# Patient Record
Sex: Female | Born: 1975 | Race: Black or African American | Hispanic: No | Marital: Married | Smoking: Never smoker
Health system: Southern US, Community
[De-identification: ages and names within clinical notes are randomized; demographics above are authoritative.]

## PROBLEM LIST (undated history)

## (undated) DIAGNOSIS — E28319 Asymptomatic premature menopause: Secondary | ICD-10-CM

---

## 1997-08-29 ENCOUNTER — Emergency Department (HOSPITAL_COMMUNITY): Admission: EM | Admit: 1997-08-29 | Discharge: 1997-08-29 | Payer: Self-pay | Admitting: Unknown Physician Specialty

## 1997-08-31 ENCOUNTER — Emergency Department (HOSPITAL_COMMUNITY): Admission: EM | Admit: 1997-08-31 | Discharge: 1997-08-31 | Payer: Self-pay | Admitting: Emergency Medicine

## 1997-09-02 ENCOUNTER — Emergency Department (HOSPITAL_COMMUNITY): Admission: EM | Admit: 1997-09-02 | Discharge: 1997-09-02 | Payer: Self-pay | Admitting: Internal Medicine

## 1997-10-05 ENCOUNTER — Emergency Department (HOSPITAL_COMMUNITY): Admission: EM | Admit: 1997-10-05 | Discharge: 1997-10-05 | Payer: Self-pay | Admitting: Emergency Medicine

## 1997-12-28 ENCOUNTER — Emergency Department (HOSPITAL_COMMUNITY): Admission: EM | Admit: 1997-12-28 | Discharge: 1997-12-28 | Payer: Self-pay | Admitting: Internal Medicine

## 1998-04-09 ENCOUNTER — Emergency Department (HOSPITAL_COMMUNITY): Admission: EM | Admit: 1998-04-09 | Discharge: 1998-04-09 | Payer: Self-pay | Admitting: Emergency Medicine

## 1998-06-06 ENCOUNTER — Emergency Department (HOSPITAL_COMMUNITY): Admission: EM | Admit: 1998-06-06 | Discharge: 1998-06-06 | Payer: Self-pay | Admitting: Internal Medicine

## 1998-06-21 ENCOUNTER — Encounter: Payer: Self-pay | Admitting: Emergency Medicine

## 1998-06-21 ENCOUNTER — Emergency Department (HOSPITAL_COMMUNITY): Admission: EM | Admit: 1998-06-21 | Discharge: 1998-06-21 | Payer: Self-pay | Admitting: Emergency Medicine

## 2000-06-17 ENCOUNTER — Emergency Department (HOSPITAL_COMMUNITY): Admission: EM | Admit: 2000-06-17 | Discharge: 2000-06-17 | Payer: Self-pay | Admitting: Emergency Medicine

## 2001-04-28 ENCOUNTER — Emergency Department (HOSPITAL_COMMUNITY): Admission: EM | Admit: 2001-04-28 | Discharge: 2001-04-28 | Payer: Self-pay | Admitting: *Deleted

## 2001-11-07 ENCOUNTER — Emergency Department (HOSPITAL_COMMUNITY): Admission: EM | Admit: 2001-11-07 | Discharge: 2001-11-07 | Payer: Self-pay | Admitting: Emergency Medicine

## 2002-12-23 ENCOUNTER — Emergency Department (HOSPITAL_COMMUNITY): Admission: EM | Admit: 2002-12-23 | Discharge: 2002-12-23 | Payer: Self-pay | Admitting: Emergency Medicine

## 2003-08-02 ENCOUNTER — Emergency Department (HOSPITAL_COMMUNITY): Admission: EM | Admit: 2003-08-02 | Discharge: 2003-08-02 | Payer: Self-pay | Admitting: Emergency Medicine

## 2003-09-26 ENCOUNTER — Emergency Department (HOSPITAL_COMMUNITY): Admission: EM | Admit: 2003-09-26 | Discharge: 2003-09-26 | Payer: Self-pay | Admitting: Emergency Medicine

## 2004-02-27 ENCOUNTER — Emergency Department (HOSPITAL_COMMUNITY): Admission: EM | Admit: 2004-02-27 | Discharge: 2004-02-27 | Payer: Self-pay | Admitting: Emergency Medicine

## 2004-12-18 ENCOUNTER — Emergency Department (HOSPITAL_COMMUNITY): Admission: EM | Admit: 2004-12-18 | Discharge: 2004-12-19 | Payer: Self-pay | Admitting: Emergency Medicine

## 2005-04-27 ENCOUNTER — Emergency Department (HOSPITAL_COMMUNITY): Admission: EM | Admit: 2005-04-27 | Discharge: 2005-04-27 | Payer: Self-pay | Admitting: Emergency Medicine

## 2005-06-21 ENCOUNTER — Ambulatory Visit: Payer: Self-pay | Admitting: Obstetrics and Gynecology

## 2005-10-15 ENCOUNTER — Emergency Department (HOSPITAL_COMMUNITY): Admission: EM | Admit: 2005-10-15 | Discharge: 2005-10-16 | Payer: Self-pay | Admitting: Emergency Medicine

## 2007-02-25 ENCOUNTER — Emergency Department (HOSPITAL_COMMUNITY): Admission: EM | Admit: 2007-02-25 | Discharge: 2007-02-25 | Payer: Self-pay | Admitting: Family Medicine

## 2008-03-08 ENCOUNTER — Emergency Department (HOSPITAL_COMMUNITY): Admission: EM | Admit: 2008-03-08 | Discharge: 2008-03-08 | Payer: Self-pay | Admitting: Emergency Medicine

## 2008-11-11 ENCOUNTER — Emergency Department (HOSPITAL_COMMUNITY): Admission: EM | Admit: 2008-11-11 | Discharge: 2008-11-11 | Payer: Self-pay | Admitting: Family Medicine

## 2009-08-24 ENCOUNTER — Emergency Department (HOSPITAL_COMMUNITY): Admission: EM | Admit: 2009-08-24 | Discharge: 2009-08-25 | Payer: Self-pay | Admitting: Emergency Medicine

## 2010-03-17 LAB — URINALYSIS, ROUTINE W REFLEX MICROSCOPIC
Bilirubin Urine: NEGATIVE
Glucose, UA: NEGATIVE mg/dL
Hgb urine dipstick: NEGATIVE
Ketones, ur: NEGATIVE mg/dL
Nitrite: NEGATIVE
Protein, ur: NEGATIVE mg/dL
Specific Gravity, Urine: 1.026 (ref 1.005–1.030)
Urobilinogen, UA: 1 mg/dL (ref 0.0–1.0)
pH: 6 (ref 5.0–8.0)

## 2010-03-17 LAB — POCT PREGNANCY, URINE: Preg Test, Ur: NEGATIVE

## 2010-05-19 NOTE — Group Therapy Note (Signed)
NAMECLAUDETTE, Patricia Price NO.:  [id number]   MEDICAL RECORD NO.:  [id number]          PATIENT TYPE:  WOC   LOCATION:  [facility]                   FACILITY:  [facility]   PHYSICIAN:  Argentina Donovan, MD        DATE OF BIRTH:  04-Mar-1975   DATE OF SERVICE:                                    CLINIC NOTE   HISTORY OF PRESENT ILLNESS:  The patient is a 35 year old African-American  female gravida 1, para 1-0-0-1 who has an 55 year old child.  For the past  few years, she has been having periods every 2 weeks.  She is unable to take  any type of pill, and has a history very likely to be related to polycystic  ovarian syndrome as she has had abnormal hair growth on her face with slight  acne and __________ on the abdomen.  She has also had a significant weight  problem, being 5 feet 3 inches and 189 pounds.  She drives Medicare people  around.  It is a job, and has a complaint of significant hot flashes even  when the air conditioning is on and sweats.  We have discussed with her, and  she has been sent by the health department, the possible problem.  We cannot  use Glucophage as she cannot swallow pills, and we cannot use oral  contraceptives so I thought the best alternative is probably the Ortho-Evra  patch, and we have instructed her in its use.  We are also going to do a  hemoglobin A1C, free testosterone and a TSH level.   IMPRESSION:  1.  Probably polycystic ovarian syndrome.  2.  Hot flashes.  3.  Abnormal uterine bleeding.           ______________________________  Argentina Donovan, MD     PR/MEDQ  D:  06/21/2005  T:  06/21/2005  Job:  [id number]

## 2010-09-22 LAB — INFLUENZA A AND B ANTIGEN (CONVERTED LAB)
Inflenza A Ag: NEGATIVE
Influenza B Ag: NEGATIVE

## 2012-02-06 ENCOUNTER — Encounter (HOSPITAL_COMMUNITY): Payer: Self-pay | Admitting: Adult Health

## 2012-02-06 ENCOUNTER — Emergency Department (HOSPITAL_COMMUNITY)
Admission: EM | Admit: 2012-02-06 | Discharge: 2012-02-07 | Disposition: A | Discharge status: Home / Self Care | Payer: 59 | Attending: Emergency Medicine | Admitting: Emergency Medicine

## 2012-02-06 ENCOUNTER — Emergency Department (HOSPITAL_COMMUNITY): Payer: 59

## 2012-02-06 DIAGNOSIS — Y9389 Activity, other specified: Secondary | ICD-10-CM | POA: Insufficient documentation

## 2012-02-06 DIAGNOSIS — S139XXA Sprain of joints and ligaments of unspecified parts of neck, initial encounter: Secondary | ICD-10-CM | POA: Insufficient documentation

## 2012-02-06 DIAGNOSIS — R209 Unspecified disturbances of skin sensation: Secondary | ICD-10-CM | POA: Insufficient documentation

## 2012-02-06 DIAGNOSIS — S0990XA Unspecified injury of head, initial encounter: Secondary | ICD-10-CM | POA: Insufficient documentation

## 2012-02-06 DIAGNOSIS — S0003XA Contusion of scalp, initial encounter: Secondary | ICD-10-CM | POA: Insufficient documentation

## 2012-02-06 DIAGNOSIS — S161XXA Strain of muscle, fascia and tendon at neck level, initial encounter: Secondary | ICD-10-CM

## 2012-02-06 DIAGNOSIS — Y929 Unspecified place or not applicable: Secondary | ICD-10-CM | POA: Insufficient documentation

## 2012-02-06 DIAGNOSIS — W2209XA Striking against other stationary object, initial encounter: Secondary | ICD-10-CM | POA: Insufficient documentation

## 2012-02-06 NOTE — ED Provider Notes (Signed)
History     CSN: [id number]  Arrival date & time 02/06/12  1478   First MD Initiated Contact with Patient 02/06/12 2255      Chief Complaint  Patient presents with  . Head Injury   HPI  History provided by the patient. Patient is a 37 year old female with no significant PMH who presents after a head injury earlier today. 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 Since that time she has had focal pain and tenderness to the top of her scalp but also complained of some neck soreness and pains. She has also reported some occasional tingling in her arms and legs. These are brief and are not persistent. Patient really did not think much of her symptoms but was encouraged by friends and family to have her symptoms evaluated in the emergency room. She denies having any vision change. Denies any nausea vomiting. Denies any diffuse headache. Denies any weakness in extremities.    History reviewed. No pertinent past medical history.  History reviewed. No pertinent past surgical history.  History reviewed. No pertinent family history.  History  Substance Use Topics  . Smoking status: Never Smoker   . Smokeless tobacco: Not on file  . Alcohol Use: No    OB History    Grav Para Term Preterm Abortions TAB SAB Ect Mult Living                  Review of Systems  Constitutional: Negative for fever.  Gastrointestinal: Negative for nausea and vomiting.  Neurological: Positive for numbness. Negative for weakness and headaches.  All other systems reviewed and are negative.    Allergies  Review of patient's allergies indicates no known allergies.  Home Medications  No current outpatient prescriptions on file.  BP 136/89  Pulse 98  Temp 99 F (37.2 C) (Oral)  Resp 16  SpO2 100%  LMP 01/18/2012  Physical Exam  Nursing note and vitals  reviewed. Constitutional: She is oriented to person, place, and time. She appears well-developed and well-nourished. No distress.  HENT:  Head: Normocephalic and atraumatic.       No significant hematomas. No skull depressions. No battle sign or raccoon eyes.  Eyes: Conjunctivae normal and EOM are normal. Pupils are equal, round, and reactive to light.  Neck: Normal range of motion. Neck supple. No tracheal deviation present. No thyromegaly present.       Mild bilateral posterior neck tenderness  Cardiovascular: Normal rate and regular rhythm.   No murmur heard. Pulmonary/Chest: Effort normal and breath sounds normal. No stridor. No respiratory distress. She has no wheezes. She has no rales.  Abdominal: Soft. There is no tenderness. There is no rigidity, no rebound, no guarding, no CVA tenderness and no tenderness at McBurney's point.  Musculoskeletal: She exhibits no edema and no tenderness.  Neurological: She is alert and oriented to person, place, and time. She has normal strength. No cranial nerve deficit or sensory deficit. Gait normal.  Skin: Skin is warm and dry. No rash noted.  Psychiatric: She has a normal mood and affect. Her behavior is normal.    ED Course  Procedures   Ct Head Wo Contrast  02/06/2012  *RADIOLOGY REPORT*  Clinical Data:  Blunt head trauma.  Pain.  Numbness and tingling in both hands and legs.  CT HEAD  WITHOUT CONTRAST CT CERVICAL SPINE WITHOUT CONTRAST  Technique:  Multidetector CT imaging of the head and cervical spine was performed following the standard protocol without intravenous contrast.  Multiplanar CT image reconstructions of the cervical spine were also generated.  Comparison:   None  CT HEAD  Findings: There is no evidence of intracranial hemorrhage, brain edema or other signs of acute infarction.  There is no evidence of intracranial mass lesion or mass effect.  No abnormal extra-axial fluid collections are identified.  Ventricles are normal in size.  No  evidence of skull fracture or other bone abnormality.  IMPRESSION: Negative noncontrast head CT.  CT CERVICAL SPINE  Findings: No evidence of cervical spine fracture or subluxation. Intervertebral disc spaces are maintained.  No evidence of facet arthropathy or other significant bone abnormality.  IMPRESSION: Negative.  No evidence of cervical spine fracture or subluxation.   Original Report Authenticated By: Myles Rosenthal, M.D.    Ct Cervical Spine Wo Contrast  02/06/2012  *RADIOLOGY REPORT*  Clinical Data:  Blunt head trauma.  Pain.  Numbness and tingling in both hands and legs.  CT HEAD WITHOUT CONTRAST CT CERVICAL SPINE WITHOUT CONTRAST  Technique:  Multidetector CT imaging of the head and cervical spine was performed following the standard protocol without intravenous contrast.  Multiplanar CT image reconstructions of the cervical spine were also generated.  Comparison:   None  CT HEAD  Findings: There is no evidence of intracranial hemorrhage, brain edema or other signs of acute infarction.  There is no evidence of intracranial mass lesion or mass effect.  No abnormal extra-axial fluid collections are identified.  Ventricles are normal in size.  No evidence of skull fracture or other bone abnormality.  IMPRESSION: Negative noncontrast head CT.  CT CERVICAL SPINE  Findings: No evidence of cervical spine fracture or subluxation. Intervertebral disc spaces are maintained.  No evidence of facet arthropathy or other significant bone abnormality.  IMPRESSION: Negative.  No evidence of cervical spine fracture or subluxation.   Original Report Authenticated By: Myles Rosenthal, M.D.      1. Head injury   2. Scalp contusion   3. Cervical strain       MDM  Patient seen and evaluated. Patient appears well in no acute distress. Normal nonfocal neuro exam. CT of head and neck normal. Patient stable for discharge home.         Angus Seller, [place] 02/06/12 [phone]

## 2012-02-06 NOTE — ED Notes (Addendum)
Presents with head injury that occurred at 0930 this am. Stepping into a truck and hit top of head on door jamb, did not lose consciousness. This evening began having numbness and tingling to bilateral hands and bilateral legs. C/o tension in both shoulders. Movement makes numbness better.  Pt is alert and oriented, MAEX4. PERRLA. C-collar applied

## 2012-02-07 NOTE — ED Provider Notes (Signed)
Medical screening examination/treatment/procedure(s) were performed by non-physician practitioner and as supervising physician I was immediately available for consultation/collaboration.  Caison Hearn R. Refael Fulop, MD 02/07/12 0010 

## 2012-11-16 ENCOUNTER — Encounter (HOSPITAL_COMMUNITY): Payer: Self-pay | Admitting: *Deleted

## 2012-11-16 ENCOUNTER — Inpatient Hospital Stay (HOSPITAL_COMMUNITY)
Admission: AD | Admit: 2012-11-16 | Discharge: 2012-11-16 | Disposition: A | Discharge status: Home / Self Care | Payer: 59 | Source: Ambulatory Visit | Attending: Obstetrics and Gynecology | Admitting: Obstetrics and Gynecology

## 2012-11-16 DIAGNOSIS — N949 Unspecified condition associated with female genital organs and menstrual cycle: Secondary | ICD-10-CM

## 2012-11-16 DIAGNOSIS — N938 Other specified abnormal uterine and vaginal bleeding: Secondary | ICD-10-CM | POA: Insufficient documentation

## 2012-11-16 HISTORY — DX: Asymptomatic premature menopause: E28.319

## 2012-11-16 LAB — CBC
HCT: 36.3 % (ref 36.0–46.0)
Hemoglobin: 11.8 g/dL — ABNORMAL LOW (ref 12.0–15.0)
MCHC: 32.5 g/dL (ref 30.0–36.0)
RBC: 4.52 MIL/uL (ref 3.87–5.11)
WBC: 9.7 10*3/uL (ref 4.0–10.5)

## 2012-11-16 LAB — URINALYSIS, ROUTINE W REFLEX MICROSCOPIC
Bilirubin Urine: NEGATIVE
Ketones, ur: NEGATIVE mg/dL
Leukocytes, UA: NEGATIVE
Nitrite: NEGATIVE
Urobilinogen, UA: 0.2 mg/dL (ref 0.0–1.0)
pH: 6 (ref 5.0–8.0)

## 2012-11-16 LAB — URINE MICROSCOPIC-ADD ON

## 2012-11-16 LAB — POCT PREGNANCY, URINE: Preg Test, Ur: NEGATIVE

## 2012-11-16 MED ORDER — MEDROXYPROGESTERONE ACETATE 10 MG PO TABS: 10.0000 mg | ORAL_TABLET | Freq: Every day | ORAL | Status: AC

## 2012-11-16 NOTE — MAU Note (Signed)
Vaginal bleeding and cramping since Wednesday. Passing clots. LMP 11/12

## 2012-11-16 NOTE — MAU Note (Signed)
Patient unable to urinate at this time. 

## 2012-11-16 NOTE — MAU Provider Note (Signed)
History     CSN: [id number]  Arrival date and time: 11/16/12 0122   None     Chief Complaint  Patient presents with  . Vaginal Bleeding   HPI  Pt is a 37 yo here with report of vaginal bleeding that started 3 days ago.  Bleeding started out light but has gradually increased and now passing clots.  Cycle last month only lasted one day.  Pt states she was diagnosed with "early menopause years ago", reports having cycles every month for past two years.  Bleeding is also associated with uterine cramping.  +sexually active.      Past Medical History  Diagnosis Date  . Early menopause     History reviewed. No pertinent past surgical history.  Family History  Problem Relation Age of Onset  . Hypertension Father   . Hyperlipidemia Father   . Diabetes Father   . Heart disease Father     History  Substance Use Topics  . Smoking status: Never Smoker   . Smokeless tobacco: Not on file  . Alcohol Use: No    Allergies: No Known Allergies  Prescriptions prior to admission  Medication Sig Dispense Refill  . ibuprofen (ADVIL,MOTRIN) 200 MG tablet Take 400 mg by mouth every 6 (six) hours as needed.        Review of Systems  Gastrointestinal: Positive for abdominal pain (cramping). Negative for nausea and vomiting.  Genitourinary:       Vaginal bleeding  All other systems reviewed and are negative.   Physical Exam   Blood pressure 149/87, pulse 97, temperature 98.1 F (36.7 C), temperature source Oral, resp. rate 18, height 5\' 2"  (1.575 m), weight 107.502 kg (237 lb), last menstrual period 11/12/2012, SpO2 98.00%.  Physical Exam  Constitutional: She is oriented to person, place, and time. She appears well-developed and well-nourished. No distress.  HENT:  Head: Normocephalic.  Neck: Normal range of motion. Neck supple.  Cardiovascular: Normal rate, regular rhythm and normal heart sounds.   Respiratory: Effort normal and breath sounds normal.  GI: Soft. There is no  tenderness.  Genitourinary: There is bleeding (negative clots; scant bleeding) around the vagina.  Uterus and adnexa difficult to palpate secondary to weight  Neurological: She is alert and oriented to person, place, and time. She has normal reflexes.  Skin: Skin is warm and dry.    MAU Course  Procedures 0220 Pt unable to void - UPT pending, given water to help facilitate urination  Results for orders placed during the hospital encounter of 11/16/12 (from the past 24 hour(s))  WET PREP, GENITAL     Status: Abnormal   Collection Time    11/16/12  2:20 AM      Result Value Range   Yeast Wet Prep HPF POC NONE SEEN  NONE SEEN   Trich, Wet Prep NONE SEEN  NONE SEEN   Clue Cells Wet Prep HPF POC NONE SEEN  NONE SEEN   WBC, Wet Prep HPF POC RARE (*) NONE SEEN  URINALYSIS, ROUTINE W REFLEX MICROSCOPIC     Status: Abnormal   Collection Time    11/16/12  2:55 AM      Result Value Range   Color, Urine YELLOW  YELLOW   APPearance CLEAR  CLEAR   Specific Gravity, Urine 1.020  1.005 - 1.030   pH 6.0  5.0 - 8.0   Glucose, UA NEGATIVE  NEGATIVE mg/dL   Hgb urine dipstick LARGE (*) NEGATIVE   Bilirubin Urine NEGATIVE  NEGATIVE   Ketones, ur NEGATIVE  NEGATIVE mg/dL   Protein, ur NEGATIVE  NEGATIVE mg/dL   Urobilinogen, UA 0.2  0.0 - 1.0 mg/dL   Nitrite NEGATIVE  NEGATIVE   Leukocytes, UA NEGATIVE  NEGATIVE  URINE MICROSCOPIC-ADD ON     Status: None   Collection Time    11/16/12  2:55 AM      Result Value Range   Squamous Epithelial / LPF RARE  RARE   RBC / HPF TOO NUMEROUS TO COUNT  <3 RBC/hpf   Bacteria, UA RARE  RARE  POCT PREGNANCY, URINE     Status: None   Collection Time    11/16/12  3:04 AM      Result Value Range   Preg Test, Ur NEGATIVE  NEGATIVE  CBC     Status: Abnormal   Collection Time    11/16/12  3:24 AM      Result Value Range   WBC 9.7  4.0 - 10.5 K/uL   RBC 4.52  3.87 - 5.11 MIL/uL   Hemoglobin 11.8 (*) 12.0 - 15.0 g/dL   HCT 40.9  81.1 - 91.4 %   MCV 80.3   78.0 - 100.0 fL   MCH 26.1  26.0 - 34.0 pg   MCHC 32.5  30.0 - 36.0 g/dL   RDW 78.2  95.6 - 21.3 %   Platelets 212  150 - 400 K/uL    Assessment and Plan  Dysfunctional Uterine Bleeding  Plan: Discharge to home RX Provera 10 mg qd Pelvic ultrasound outpatient next week Follow-up in clinic in 2-3 weeks  [facility] 11/16/2012, 2:23 AM

## 2012-11-17 LAB — GC/CHLAMYDIA PROBE AMP: GC Probe RNA: NEGATIVE

## 2012-11-21 ENCOUNTER — Ambulatory Visit (HOSPITAL_COMMUNITY)
Admission: RE | Admit: 2012-11-21 | Discharge: 2012-11-21 | Disposition: A | Discharge status: Home / Self Care | Payer: 59 | Source: Ambulatory Visit | Attending: Family | Admitting: Family

## 2012-11-21 DIAGNOSIS — N938 Other specified abnormal uterine and vaginal bleeding: Secondary | ICD-10-CM

## 2012-11-21 DIAGNOSIS — N83209 Unspecified ovarian cyst, unspecified side: Secondary | ICD-10-CM | POA: Insufficient documentation

## 2012-11-21 DIAGNOSIS — N949 Unspecified condition associated with female genital organs and menstrual cycle: Secondary | ICD-10-CM | POA: Insufficient documentation

## 2012-12-08 ENCOUNTER — Telehealth: Payer: Self-pay | Admitting: *Deleted

## 2012-12-08 NOTE — Telephone Encounter (Signed)
Pt called nurse line requesting phone call back.

## 2012-12-09 NOTE — Telephone Encounter (Signed)
Called Patricia Price and left a message I am returning her call, if she still needs assistance please call back and leave a detailed message how we may help you

## 2012-12-11 NOTE — Telephone Encounter (Signed)
Called patient back stating I was returning her phone call. Patient stated she just wanted her ultrasound results. Reviewed results with patient and asked if she had a gyn doctor. Patient stated no, I told patient that from looking at the ER notes, they wanted her to follow up with [place] in the clinic and that with her permission I would tell the front office staff to schedule her an appt. Patient stated that was fine. Patient had no further questions

## 2012-12-11 NOTE — Telephone Encounter (Signed)
Called patient and a man answered stating she wasn't there at the moment and would like her know a nurse is trying to contact her.

## 2012-12-12 ENCOUNTER — Encounter: Payer: Self-pay | Admitting: Obstetrics & Gynecology

## 2013-01-14 ENCOUNTER — Encounter: Payer: Self-pay | Admitting: Obstetrics & Gynecology

## 2013-01-14 ENCOUNTER — Ambulatory Visit (INDEPENDENT_AMBULATORY_CARE_PROVIDER_SITE_OTHER): Payer: 59 | Admitting: Obstetrics & Gynecology

## 2013-01-14 VITALS — BP 138/81 | HR 94 | Temp 97.9°F | Ht 63.0 in | Wt 234.8 lb

## 2013-01-14 DIAGNOSIS — Z01812 Encounter for preprocedural laboratory examination: Secondary | ICD-10-CM

## 2013-01-14 DIAGNOSIS — N926 Irregular menstruation, unspecified: Secondary | ICD-10-CM

## 2013-01-14 LAB — POCT PREGNANCY, URINE: Preg Test, Ur: NEGATIVE

## 2013-01-14 NOTE — Progress Notes (Signed)
Subjective:     Patient ID: Patricia Price, female   DOB: 07/05/1975, [age] y.o.   MRN: [id number]  HPI Patient is a 38 yo female G1P0101 who presents for follow-up from the MAU for increased vaginal bleeding during menstruation.  Patient notes menses started during the 5th grade. She typically has one period a month that lasts for 4-5 days. She denies bleeding between periods. She states last month she had a period that was heavier than usual lasting for 7 days. She notes changing her pad every hour during this period and passing clots. She was seen in the MAU and had an US done that revealed an endometrium of 10.5 mm and question of adenomyomatosis and also a left ovarian cyst. Since that period she has had one normal period lasting for 4 days. She is not currently on birth control.   Review of Systems See HPI     Objective:   Physical Exam  Constitutional: She appears well-developed and well-nourished. No distress.  BP 138/81  Pulse 94  Temp(Src) 97.9 F (36.6 C)  Ht 5\' 3"  (1.6 m)  Wt 234 lb 12.8 oz (106.505 kg)  BMI 41.60 kg/m2  LMP 01/10/2013     Assessment:     Normal variation of menstrual cycle.     Plan:     Discussed that this is likely a normal variation in her menstrual cycle and that variation will increase with increasing age. Will have patient follow-up in 6 months and consider additional work-up if this is a persistent issue.

## 2013-01-14 NOTE — Patient Instructions (Signed)
Menstruation Menstruation is the monthly passing of blood, tissue, fluid and mucus, also know as a period. Your body is shedding the lining of the uterus. The flow, or amount of blood, usually lasts from 3 7 days each month. Hormones control the menstrual cycle. Hormones are a chemical substance produced by endocrine glands in the body to regulate different bodily functions. The first menstrual period may start any time between age 36 years to 16 years. However, it usually starts around age 12 years. Some girls have regular monthly menstrual cycles right from the beginning. However, it is not unusual to have only a couple of drops of blood or spotting when you first start menstruating. It is also not unusual to have two periods a month or miss a month or two when first starting your periods. SYMPTOMS   Mild to moderate abdominal cramps.  Aching or pain in the lower back area. Symptoms may occur 5 10 days before your menstrual period starts. These symptoms are referred to as premenstrual syndrome (PMS). These symptoms can include:  Headache.  Breast tenderness and swelling.  Bloating.  Tiredness (fatigue).  Mood changes.  Craving for certain foods. These are normal signs and symptoms and can vary in severity. To help relieve these problems, ask your caregiver if you can take over-the-counter medications for pain or discomfort. If the symptoms are not controllable, see your caregiver for help.  HORMONES INVOLVED IN MENSTRUATION Menstruation comes about because of hormones produced by the pituitary gland in the brain and the ovaries that affect the uterine lining. First, the pituitary gland in the brain produces the hormone follicle stimulating hormone (FSH). FSH stimulates the ovaries to produce estrogen, which thickens the uterine lining and begins to develop an egg in the ovary. About 14 days later, the pituitary gland produces another hormone called luteinizing hormone (LH). LH causes the egg  to come out of a sac in the ovary (ovulation). The empty sac on the ovary called the corpus luteum is stimulated by another hormone from the pituitary gland called luteotropin. The corpus luteum begins to produce the estrogen and progesterone hormone. The progesterone hormone prepares the lining of the uterus to have the fertilized egg (egg combined with sperm) attach to the lining of the uterus and begin to develop into a fetus. If the egg is not fertilized, the corpus luteum stops producing estrogen and progesterone, it disappears, the lining of the uterus sloughs off and a menstrual period begins. Then the menstrual cycle starts all over again and will continue monthly unless pregnancy occurs or menopause begins. The secretion of hormones is complex. Various parts of the body become involved in many chemical activities. Female sex hormones have other functions in a woman's body as well. Estrogen increases a woman's sex drive (libido). It naturally helps body get rid of fluids (diuretic). It also aids in the process of building new bone. Therefore, maintaining hormonal health is essential to all levels of a woman's well being. These hormones are usually present in normal amounts and cause you to menstruate. It is the relationship between the (small) levels of the hormones that is critical. When the balance is upset, menstrual irregularities can occur. HOW DOES THE MENSTRUAL CYCLE HAPPEN?  Menstrual cycles vary in length from 21 35 days with an average of 29 days. The cycle begins on the first day of bleeding. At this time, the pituitary gland in the brain releases FSH that travels through the bloodstream to the ovaries. The FSH stimulates the   follicles in the ovaries. This prepares the body for ovulation that occurs around the 14th day of the cycle. The ovaries produce estrogen, and this makes sure conditions are right in the uterus for implantation of the fertilized egg.  When the levels of estrogen reach a  high enough level, it signals the gland in the brain (pituitary gland) to release a surge of LH. This causes the release of the ripest egg from its follicle (ovulation). Usually only one follicle releases one egg, but sometimes more than one follicle releases an egg especially when stimulating the ovaries for in vitro fertilization. The egg can then be collected by either fallopian tube to await fertilization. The burst follicle within the ovary that is left behind is now called the corpus luteum or "yellow body." The corpus luteum continues to give off (secrete) reduced amounts of estrogen. This closes and hardens the cervix. It dries up the mucus to the naturally infertile condition.  The corpus luteum also begins to give off greater amounts of progesterone. This causes the lining of the uterus (endometrium) to thicken even more in preparation for the fertilized egg. The egg is starting to journey down from the fallopian tube to the uterus. It also signals the ovaries to stop releasing eggs. It assists in returning the cervical mucus to its infertile state.  If the egg implants successfully into the womb lining and pregnancy occurs, progesterone levels will continue to raise. It is often this hormone that gives some pregnant women a feeling of well being, like a "natural high." Progesterone levels drop again after childbirth.  If fertilization does not occur, the corpus luteum dies, stopping the production of hormones. This sudden drop in progesterone causes the uterine lining to break down, accompanied by blood (menstruation).  This starts the cycle back at day 1. The whole process starts all over again. Woman go through this cycle every month from puberty to menopause. Women have breaks only for pregnancy and breastfeeding (lactation), unless the woman has health problems that affect the female hormone system or chooses to use oral contraceptives to have unnatural menstrual periods. HOME CARE  INSTRUCTIONS   Keep track of your periods by using a calendar.  If you use tampons, get the least absorbent to avoid toxic shock syndrome.  Do not leave tampons in the vagina over night or longer than 6 hours.  Wear a sanitary pad over night.  Exercise 3 5 times a week or more.  Avoid foods and drinks that you know will make your symptoms worse before or during your period. SEEK MEDICAL CARE IF:   You develop a fever with your period.  Your periods are lasting more than 7 days.  Your period is so heavy that you have to change pads or tampons every 30 minutes.  You develop clots with your period and never had clots before.  You cannot get relief from over-the-counter medication for your symptoms.  Your period has not started, and it has been longer than 35 days. Document Released: 12/08/2001 Document Revised: 10/08/2012 Document Reviewed: 07/17/2012 [employer] Patient Information 2014 [employer].  

## 2013-01-14 NOTE — Progress Notes (Signed)
Patient ID: Patricia Price, female   DOB: 09/05/1975, 38 y.o.   MRN: [id number] Attestation of Attending Supervision of Resident: Evaluation and management procedures were performed by the [facility] Resident under my supervision.  I have seen and examined the patient, reviewed the resident's note and chart, and I agree with the management and plan.  D/w with the pt that since this was her very first abnormal menses that a workup is not needed.  She is sx free currently.  I reviewed the sono results with her and suspect that the ovarian cyst is benign given the place in her cycle.    I rec a f/u in 3-6 months if cycle becomes irregular or sooner prn   [name], M.D. 01/14/2013 3:42 PM

## 2013-11-02 ENCOUNTER — Encounter: Payer: Self-pay | Admitting: Obstetrics & Gynecology

## 2016-06-30 ENCOUNTER — Encounter (HOSPITAL_COMMUNITY): Payer: Self-pay | Admitting: *Deleted

## 2016-06-30 ENCOUNTER — Ambulatory Visit (HOSPITAL_COMMUNITY)
Admission: EM | Admit: 2016-06-30 | Discharge: 2016-06-30 | Disposition: A | Discharge status: Home / Self Care | Payer: BLUE CROSS/BLUE SHIELD | Attending: Family Medicine | Admitting: Family Medicine

## 2016-06-30 DIAGNOSIS — L739 Follicular disorder, unspecified: Secondary | ICD-10-CM

## 2016-06-30 DIAGNOSIS — N926 Irregular menstruation, unspecified: Secondary | ICD-10-CM

## 2016-06-30 LAB — POCT PREGNANCY, URINE: Preg Test, Ur: NEGATIVE

## 2016-06-30 MED ORDER — FLUCONAZOLE 40 MG/ML PO SUSR: 200.0000 mg | Freq: Every day | ORAL | 0 refills | Status: AC

## 2016-06-30 NOTE — Discharge Instructions (Signed)
I have made a referral to the women's outpatient clinic for regular, routine women's health services. They will contact you within a week to 2 weeks to set up an appointment.  For your rash, this is characteristic for a fungal folliculitis, this is most common in the summertime. I've given you a prescription for Diflucan, take 5 mL once daily for 7 days. If your symptoms persist, or fail to resolve, return to clinic in one to 2 weeks.

## 2016-06-30 NOTE — ED Triage Notes (Signed)
Rash  On  l   Arm  For   Several   Weeks     The  Rash  Itched  Earlier  It  Is  Better  Now       Pt  Is  Not  Taking  Any  Medication  For  The  Rash

## 2016-06-30 NOTE — ED Provider Notes (Signed)
CSN: [id number]     Arrival date & time 06/30/16  1707 History   First MD Initiated Contact with Patient 06/30/16 1737     Chief Complaint  Patient presents with  . Rash   (Consider location/radiation/quality/duration/timing/severity/associated sxs/prior Treatment) Patricia Price is a 41 y.o. female with a past history of early menopause, who presents to the [facility] urgent care with a chief complaint of rash on the left forearm has been present for several days. States that it is sometimes itchy, however he does not currently. Patient works in healthcare, and is concerned that this is "MRSA" she has no fever, chills, nausea, vomiting, or other systemic symptoms. The rash is not painful.  She is also 66, sexually active, does not use protection, states she has intermittent periods, is been approximately 2 months since her last period, states that she has been alternating with a period every other month for some time. She does not have a gynecologist, or other women's health provider, states that she does need to establish with one, would like a referral.    The history is provided by the patient.  Rash    Past Medical History:  Diagnosis Date  . Early menopause    History reviewed. No pertinent surgical history. Family History  Problem Relation Age of Onset  . Hypertension Father   . Hyperlipidemia Father   . Diabetes Father   . Heart disease Father    Social History  Substance Use Topics  . Smoking status: Never Smoker  . Smokeless tobacco: Never Used  . Alcohol use No   OB History    Gravida Para Term Preterm AB Living   1 1   1   1    SAB TAB Ectopic Multiple Live Births           1     Review of Systems  Constitutional: Negative.   HENT: Negative.   Respiratory: Negative.   Cardiovascular: Negative.   Gastrointestinal: Negative.   Genitourinary:       Irregular periods  Musculoskeletal: Negative.   Skin: Positive for rash.  Neurological: Negative.      Allergies  Patient has no known allergies.  Home Medications   Prior to Admission medications   Medication Sig Start Date End Date Taking? Authorizing Provider  fluconazole (DIFLUCAN) 40 MG/ML suspension Take 5 mLs (200 mg total) by mouth daily. 06/30/16 07/07/16  Patricia Bodo, NP  ibuprofen (ADVIL,MOTRIN) 200 MG tablet Take 400 mg by mouth every 6 (six) hours as needed.    [provider]  medroxyPROGESTERone (PROVERA) 10 MG tablet Take 1 tablet (10 mg total) by mouth daily. Use for ten days 11/16/12   Marlis Edelson, CNM  OVER THE COUNTER MEDICATION 15 mLs daily. For iron    [provider]  PRESCRIPTION MEDICATION 1 tablet daily. Antibiotic    [provider]   Meds Ordered and Administered this Visit  Medications - No data to display  BP 122/72 (BP Location: Right Arm)   Pulse 78   Temp 98.6 F (37 C) (Oral)   Resp 18   SpO2 100%  No data found.   Physical Exam  Constitutional: She is oriented to person, place, and time. She appears well-developed and well-nourished. No distress.  HENT:  Head: Normocephalic and atraumatic.  Right Ear: External ear normal.  Left Ear: External ear normal.  Eyes: Conjunctivae are normal.  Neck: Normal range of motion.  Neurological: She is alert and oriented to  person, place, and time.  Skin: Skin is warm and dry. Capillary refill takes less than 2 seconds. Rash noted. She is not diaphoretic. No erythema.  Proximately 4 x 6" area on the left forearm with multiple small pustules. No erythema noted.  Psychiatric: She has a normal mood and affect. Her behavior is normal.  Nursing note and vitals reviewed.   Urgent Care Course     Procedures (including critical care time)  Labs Review Labs Reviewed  POCT PREGNANCY, URINE    Imaging Review No results found.      MDM   1. Folliculitis   2. Irregular periods    Pregnancy test negative, referral made to the women's outpatient center for  routine gynecological care.  Rashes most consistent with fungal folliculitis, does not have an appearance of a "MRSA" infection. Treating presumptively with Diflucan, return to clinic in one week as needed.     Patricia Price, Patricia Hino, NP 06/30/16 1907

## 2016-07-11 ENCOUNTER — Other Ambulatory Visit: Payer: Self-pay | Admitting: *Deleted

## 2016-07-11 ENCOUNTER — Telehealth: Payer: Self-pay | Admitting: *Deleted

## 2016-07-11 DIAGNOSIS — N926 Irregular menstruation, unspecified: Secondary | ICD-10-CM

## 2016-07-11 NOTE — Telephone Encounter (Signed)
I called Patricia Price to notify her of US I have scheduled for before her new gyn appt.   I left a message I am calling her re: US appt , please call our office. Will have front office schedule gyn appt and call her or send letter notifying her of both appointments.

## 2016-07-20 ENCOUNTER — Encounter (HOSPITAL_COMMUNITY): Payer: Self-pay

## 2016-07-20 ENCOUNTER — Ambulatory Visit (HOSPITAL_COMMUNITY)
Admission: RE | Admit: 2016-07-20 | Discharge: 2016-07-20 | Disposition: A | Discharge status: Home / Self Care | Payer: BLUE CROSS/BLUE SHIELD | Source: Ambulatory Visit | Attending: Obstetrics & Gynecology | Admitting: Obstetrics & Gynecology

## 2016-07-20 DIAGNOSIS — N926 Irregular menstruation, unspecified: Secondary | ICD-10-CM

## 2016-08-08 ENCOUNTER — Encounter: Payer: BLUE CROSS/BLUE SHIELD | Admitting: Obstetrics and Gynecology

## 2017-11-18 ENCOUNTER — Other Ambulatory Visit: Payer: Self-pay | Admitting: Obstetrics

## 2017-11-18 DIAGNOSIS — R928 Other abnormal and inconclusive findings on diagnostic imaging of breast: Secondary | ICD-10-CM

## 2017-11-21 ENCOUNTER — Ambulatory Visit
Admission: RE | Admit: 2017-11-21 | Discharge: 2017-11-21 | Disposition: A | Discharge status: Home / Self Care | Payer: BLUE CROSS/BLUE SHIELD | Source: Ambulatory Visit | Attending: Obstetrics | Admitting: Obstetrics

## 2017-11-21 ENCOUNTER — Other Ambulatory Visit: Payer: Self-pay | Admitting: Obstetrics

## 2017-11-21 DIAGNOSIS — N63 Unspecified lump in unspecified breast: Secondary | ICD-10-CM

## 2017-11-21 DIAGNOSIS — R928 Other abnormal and inconclusive findings on diagnostic imaging of breast: Secondary | ICD-10-CM

## 2018-05-21 ENCOUNTER — Other Ambulatory Visit: Payer: BLUE CROSS/BLUE SHIELD

## 2018-05-23 ENCOUNTER — Ambulatory Visit
Admission: RE | Admit: 2018-05-23 | Discharge: 2018-05-23 | Disposition: A | Discharge status: Home / Self Care | Payer: BLUE CROSS/BLUE SHIELD | Source: Ambulatory Visit | Attending: Obstetrics | Admitting: Obstetrics

## 2018-05-23 ENCOUNTER — Other Ambulatory Visit: Payer: Self-pay | Admitting: Obstetrics

## 2018-05-23 ENCOUNTER — Other Ambulatory Visit: Payer: Self-pay

## 2018-05-23 DIAGNOSIS — N6320 Unspecified lump in the left breast, unspecified quadrant: Secondary | ICD-10-CM

## 2018-05-23 DIAGNOSIS — N63 Unspecified lump in unspecified breast: Secondary | ICD-10-CM

## 2018-12-01 ENCOUNTER — Other Ambulatory Visit: Payer: BLUE CROSS/BLUE SHIELD

## 2019-02-22 IMAGING — US ULTRASOUND LEFT BREAST LIMITED
1 series · 13 of 18 positions shown · non-contrast
Comparison: Baseline screening mammogram 11/14/2017

CLINICAL DATA: Screening recall from baseline for 3 possible left
breast masses.

EXAM:
DIGITAL DIAGNOSTIC LEFT MAMMOGRAM WITH CAD AND TOMO
ULTRASOUND LEFT BREAST

[Series 1: ultrasound left breast limited · 0.09mm/px · 13 of 18 slices shown]
[im 1/18]
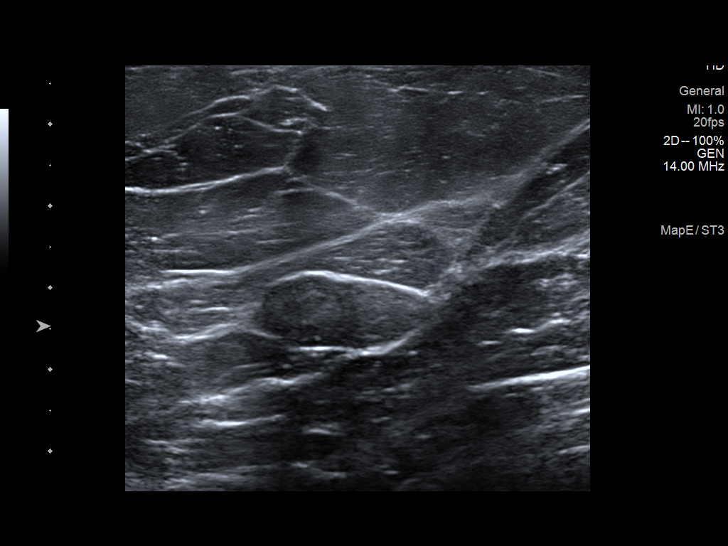
[im 3/18]
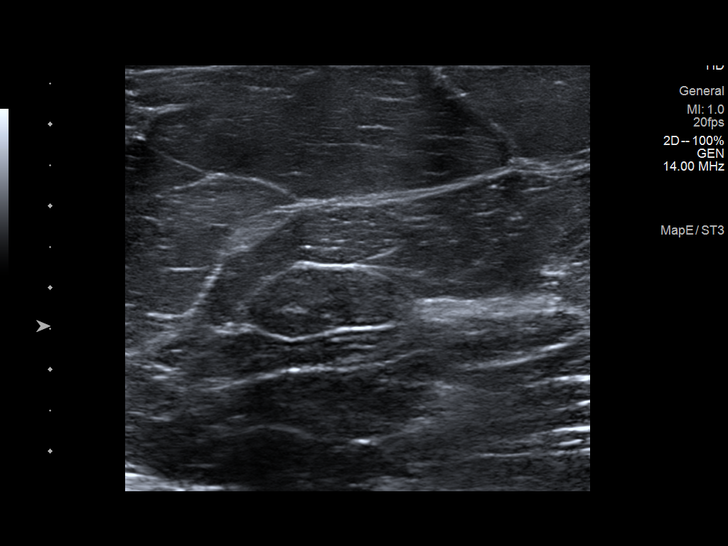
[im 4/18]
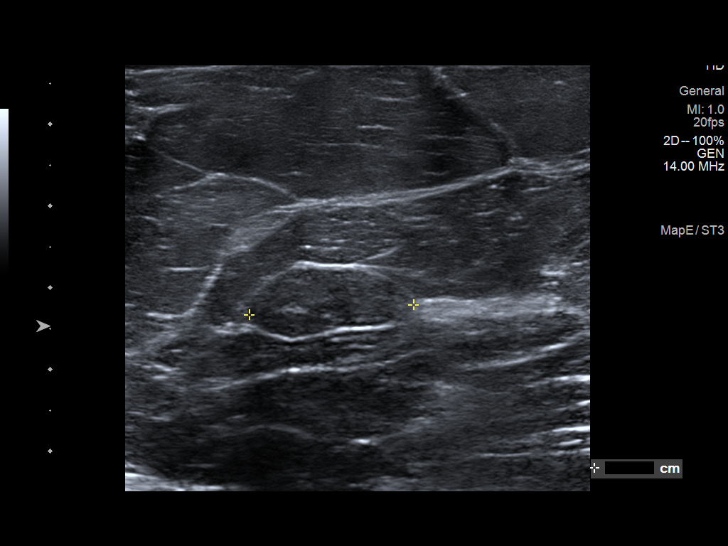
[im 5/18]
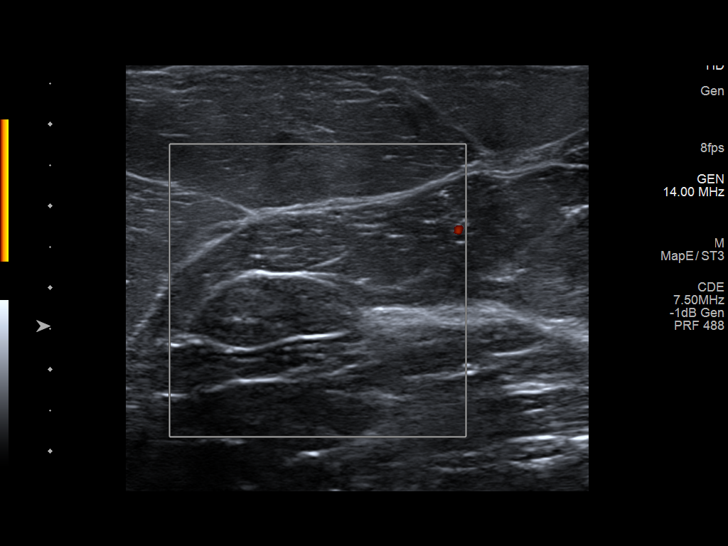
[im 7/18]
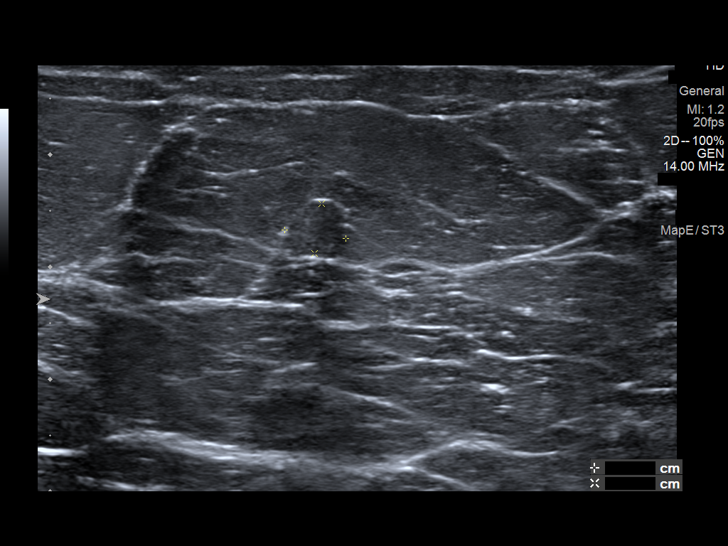
[im 8/18]
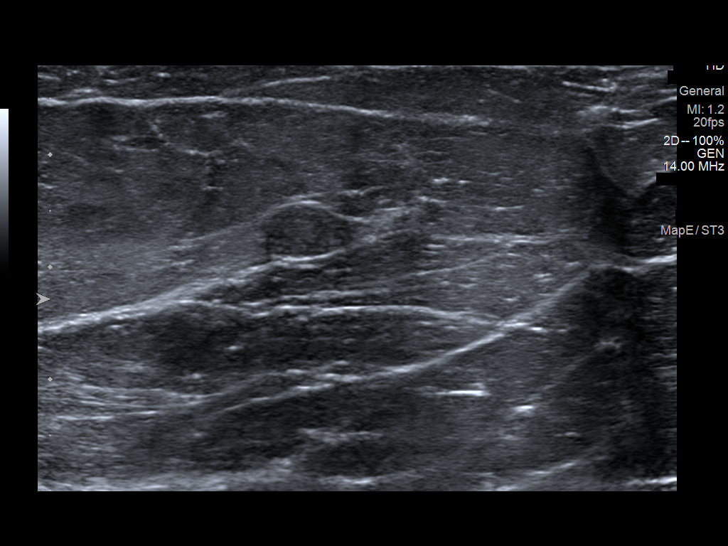
[im 10/18]
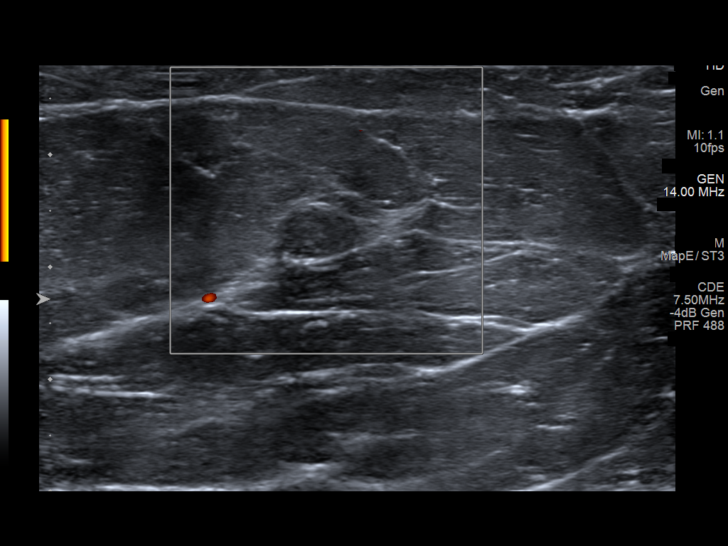
[im 11/18]
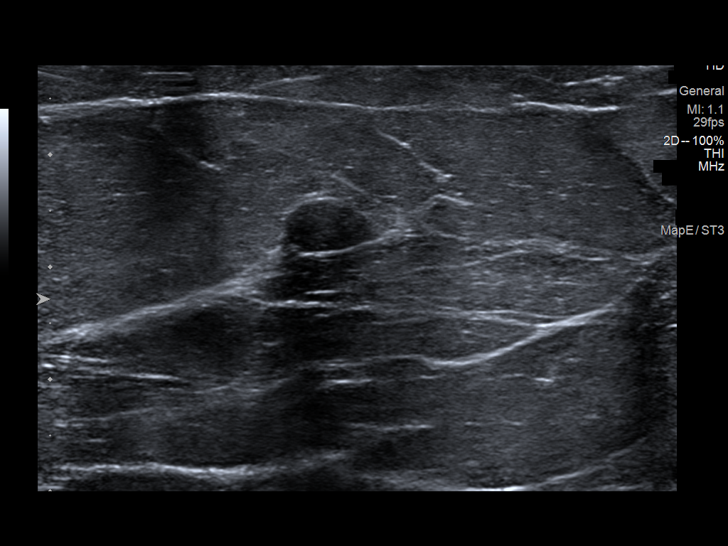
[im 12/18]
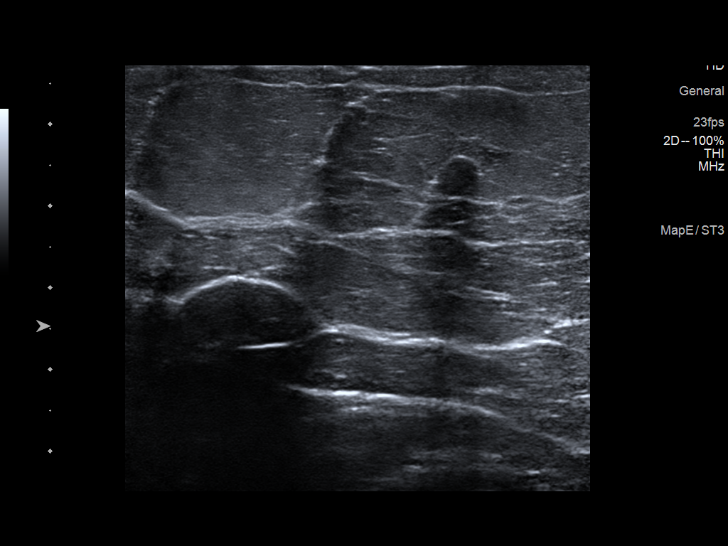
[im 14/18]
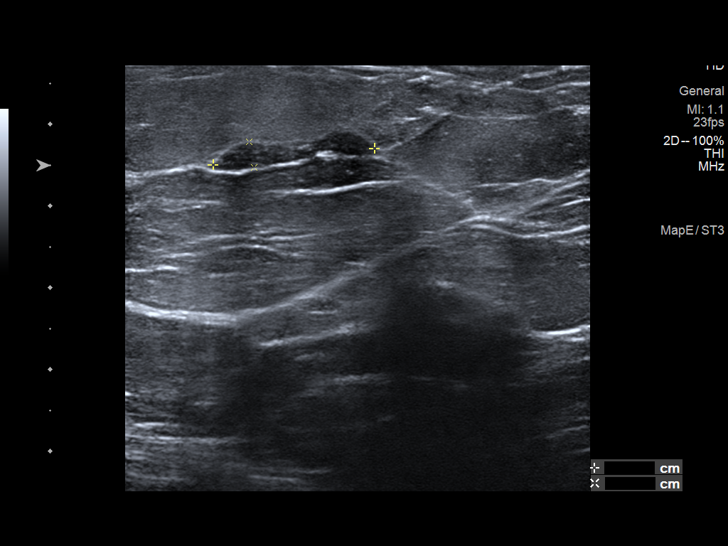
[im 15/18]
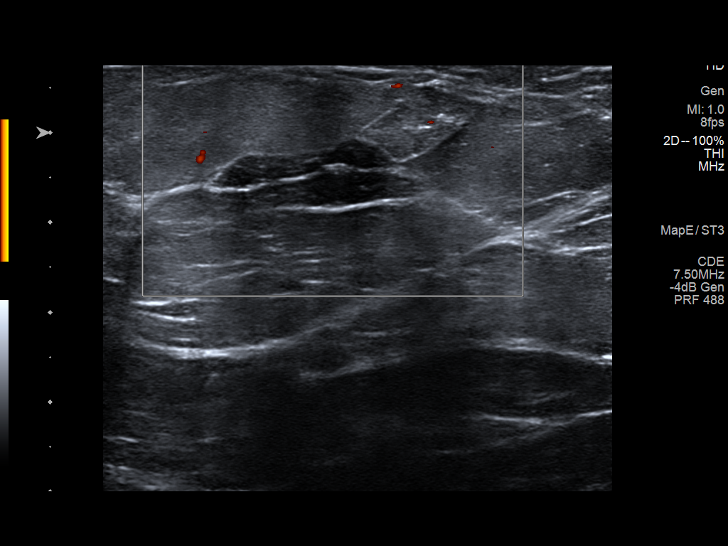
[im 16/18]
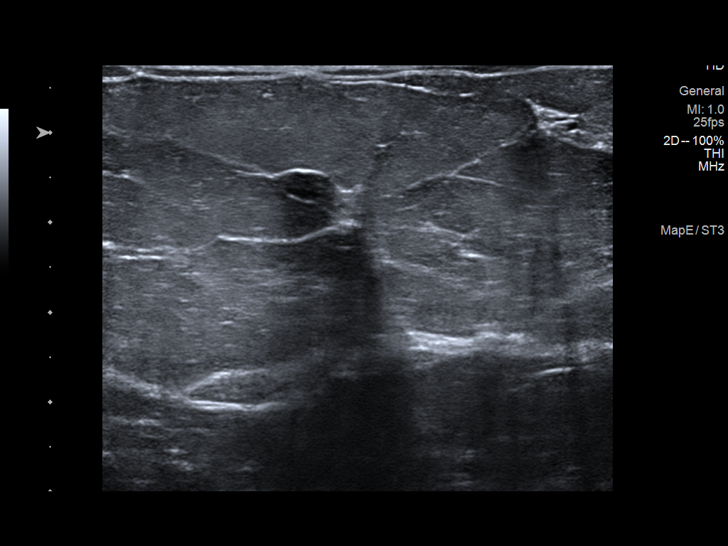
[im 18/18]
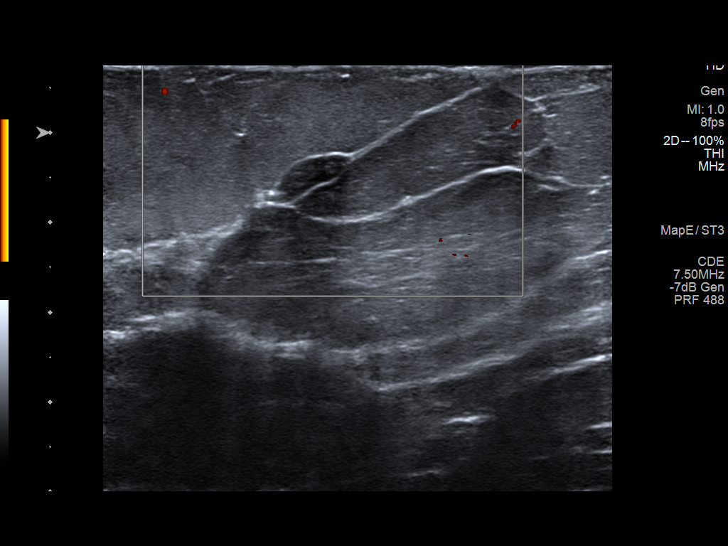

[13 of 18 positions shown; findings below may reference images not displayed]

ACR Breast Density Category b: There are scattered areas of
fibroglandular density.
FINDINGS: Spot compression tomosynthesis images of the left breast
demonstrates dense there are 3 low-density oval circumscribed masses
in the medial left breast, the largest measuring approximately
cm.

Mammographic images were processed with CAD.

Ultrasound of the left breast at 10 o'clock, 8 cm from the nipple
demonstrates an isoechoic oval circumscribed mass measuring 2.2 x
0.8 x 2.0 cm.

At 11 o'clock, 5 cm from the nipple there is an oval isoechoic
circumscribed mass measuring 0.8 x 0.5 x 0.6 cm.

At 11 o'clock, 8 cm from the nipple there is an isoechoic oval
circumscribed mass measuring 2.0 x 0.3 x 0.7 cm.

All of these masses are hypoechoic with harmonics imaging.
IMPRESSION: 1. There are 3 probably benign masses in the left breast at 10 and
11 o'clock, favored to represent fibroadenomas.

RECOMMENDATION:
Six-month follow-up left breast ultrasound is recommended.

I have discussed the findings and recommendations with the patient.
Results were also provided in writing at the conclusion of the
visit. If applicable, a reminder letter will be sent to the patient
regarding the next appointment.

BI-RADS CATEGORY  3: Probably benign.

## 2019-02-22 IMAGING — MG DIGITAL DIAGNOSTIC UNILATERAL LEFT MAMMOGRAM WITH TOMO AND CAD
4 series · 4 of 12 positions shown · non-contrast
Comparison: Baseline screening mammogram 11/14/2017

CLINICAL DATA: Screening recall from baseline for 3 possible left
breast masses.

EXAM:
DIGITAL DIAGNOSTIC LEFT MAMMOGRAM WITH CAD AND TOMO
ULTRASOUND LEFT BREAST

[L MLO synth-2D]
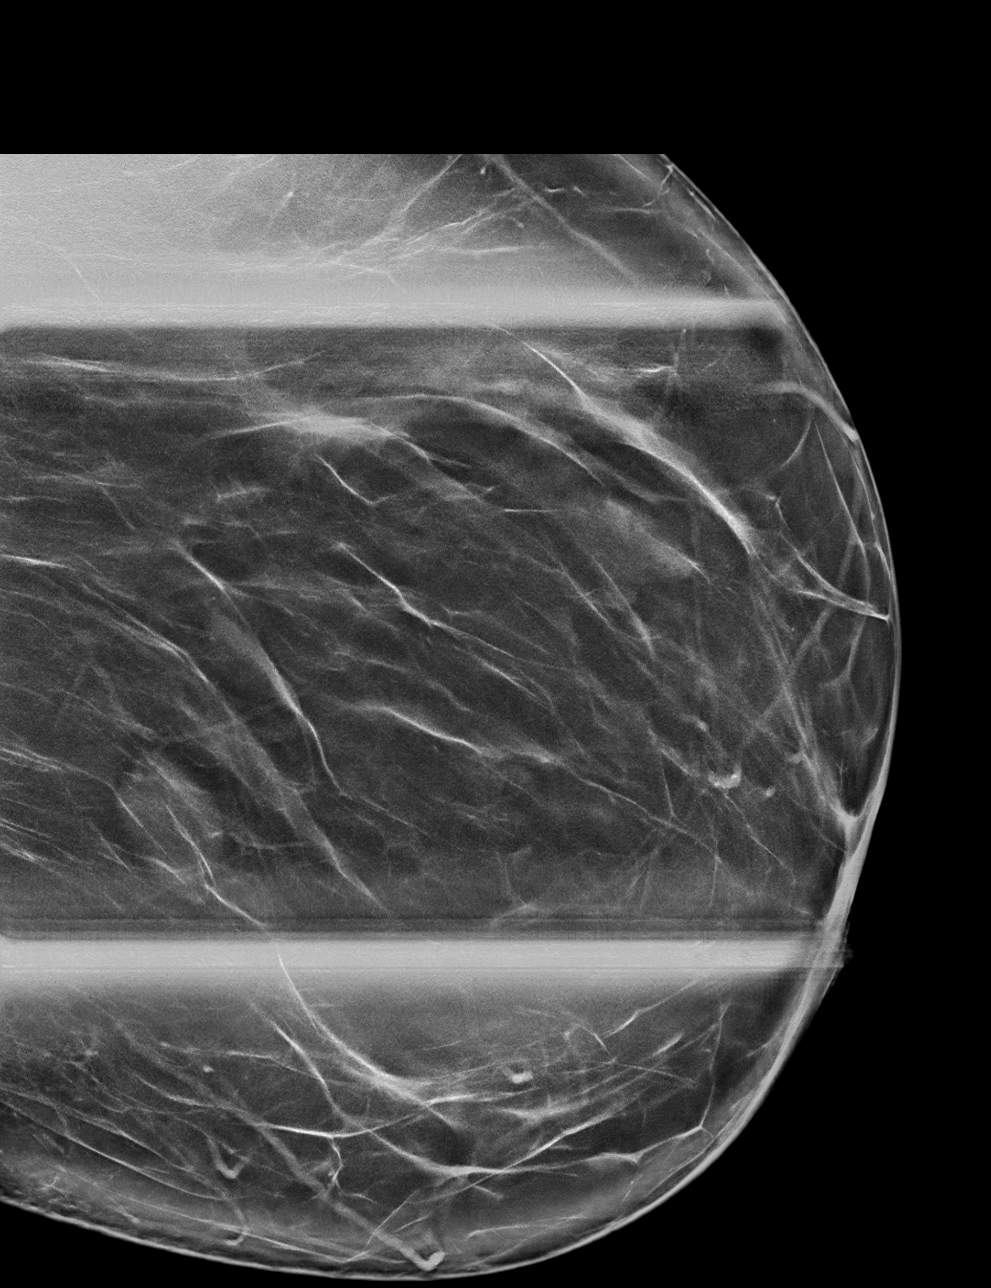

[L CC synth-2D]
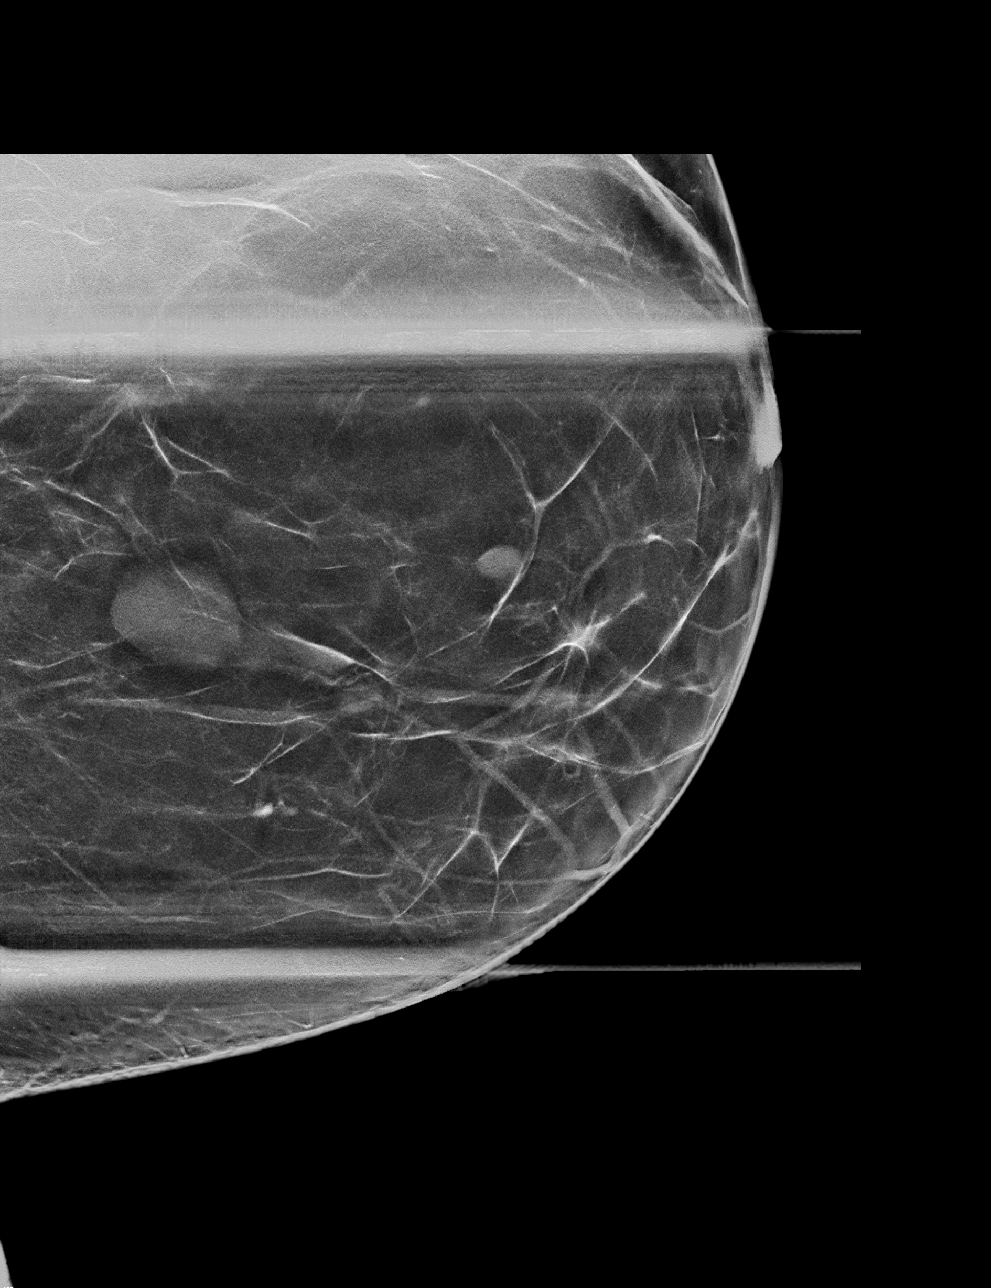

[L CC tomo · tomo slice 37/74.0]
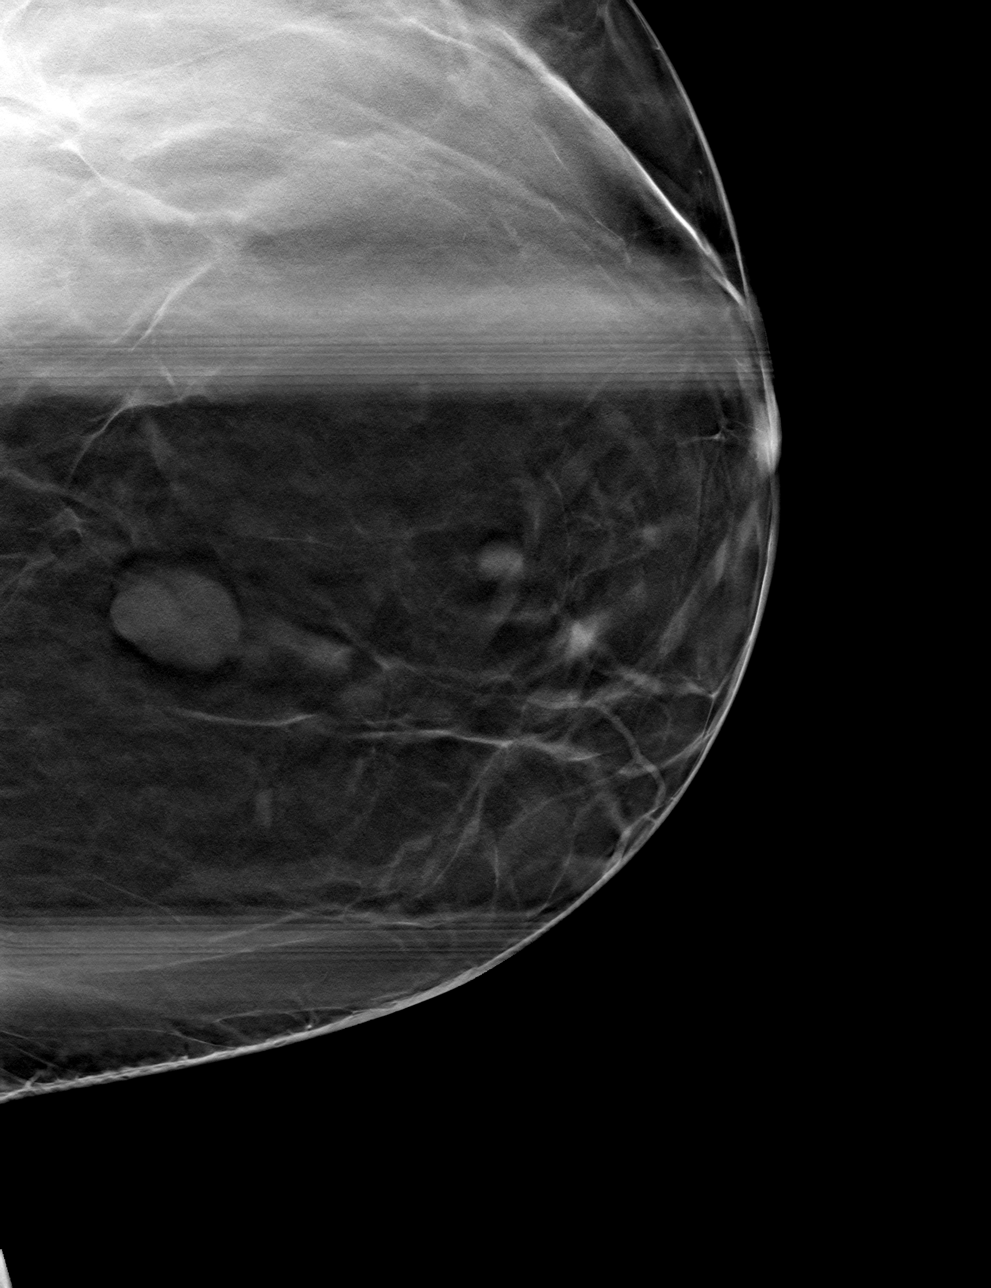

[L MLO tomo · tomo slice 46/91.0]
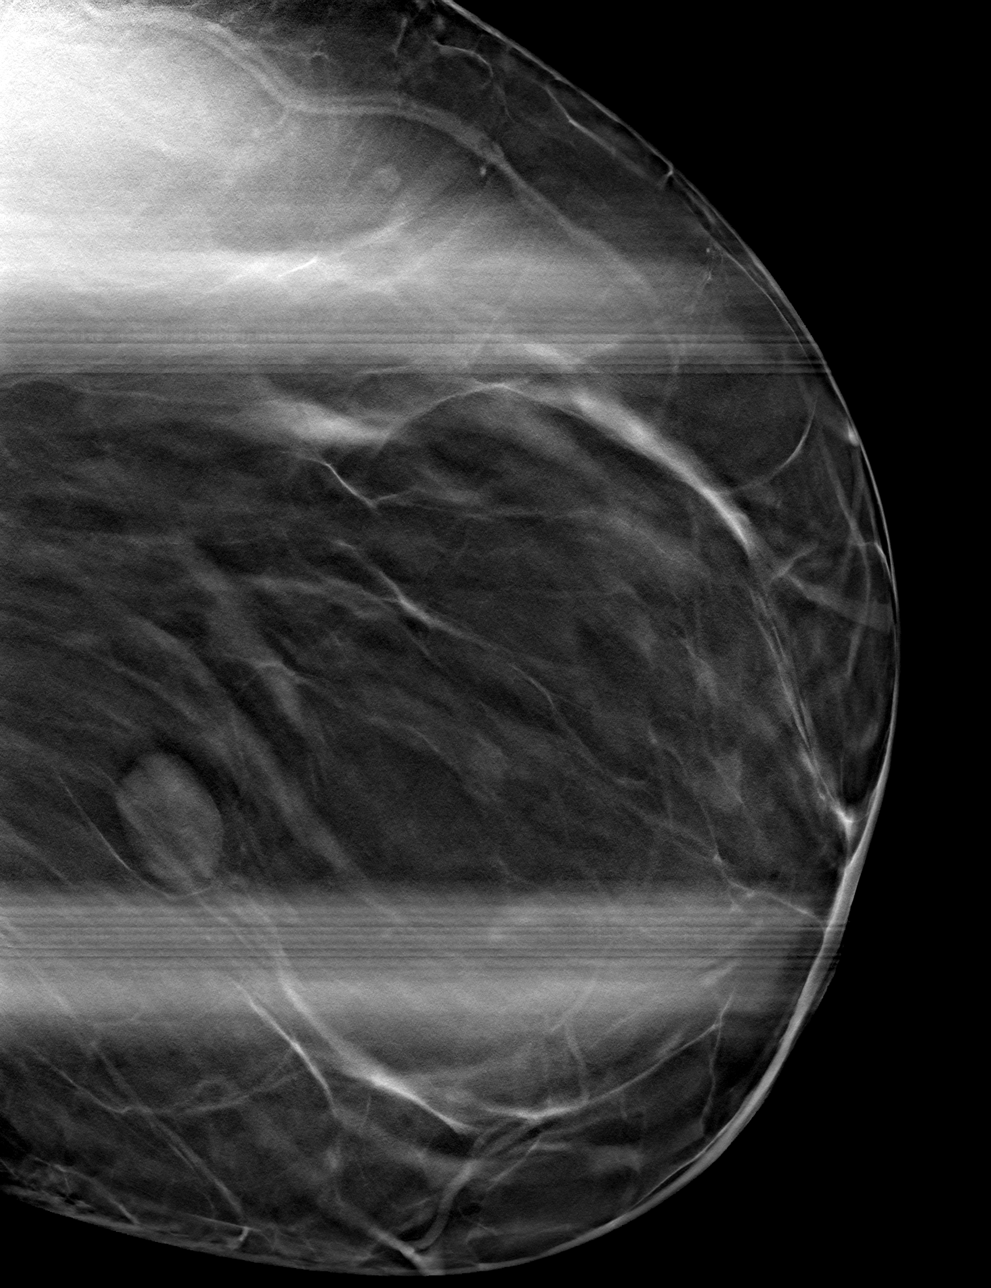

[4 of 12 positions shown; findings below may reference images not displayed]

ACR Breast Density Category b: There are scattered areas of
fibroglandular density.
FINDINGS: Spot compression tomosynthesis images of the left breast
demonstrates dense there are 3 low-density oval circumscribed masses
in the medial left breast, the largest measuring approximately
cm.

Mammographic images were processed with CAD.

Ultrasound of the left breast at 10 o'clock, 8 cm from the nipple
demonstrates an isoechoic oval circumscribed mass measuring 2.2 x
0.8 x 2.0 cm.

At 11 o'clock, 5 cm from the nipple there is an oval isoechoic
circumscribed mass measuring 0.8 x 0.5 x 0.6 cm.

At 11 o'clock, 8 cm from the nipple there is an isoechoic oval
circumscribed mass measuring 2.0 x 0.3 x 0.7 cm.

All of these masses are hypoechoic with harmonics imaging.
IMPRESSION: 1. There are 3 probably benign masses in the left breast at 10 and
11 o'clock, favored to represent fibroadenomas.

RECOMMENDATION:
Six-month follow-up left breast ultrasound is recommended.

I have discussed the findings and recommendations with the patient.
Results were also provided in writing at the conclusion of the
visit. If applicable, a reminder letter will be sent to the patient
regarding the next appointment.

BI-RADS CATEGORY  3: Probably benign.
